# Patient Record
Sex: Female | Born: 1950 | Race: Black or African American | Hispanic: No | Smoking: Never smoker
Health system: Southern US, Community
[De-identification: ages and names within clinical notes are randomized; demographics above are authoritative.]

## PROBLEM LIST (undated history)

## (undated) DIAGNOSIS — I10 Essential (primary) hypertension: Secondary | ICD-10-CM

## (undated) HISTORY — DX: Essential (primary) hypertension: I10

---

## 2007-04-24 ENCOUNTER — Other Ambulatory Visit: Admission: RE | Admit: 2007-04-24 | Discharge: 2007-04-24 | Payer: Self-pay | Admitting: Family Medicine

## 2007-06-11 ENCOUNTER — Ambulatory Visit: Payer: Self-pay | Admitting: Internal Medicine

## 2008-01-21 ENCOUNTER — Emergency Department (HOSPITAL_COMMUNITY): Admission: EM | Admit: 2008-01-21 | Discharge: 2008-01-21 | Payer: Self-pay | Admitting: Emergency Medicine

## 2008-06-29 ENCOUNTER — Emergency Department (HOSPITAL_COMMUNITY): Admission: EM | Admit: 2008-06-29 | Discharge: 2008-06-29 | Payer: Self-pay | Admitting: Emergency Medicine

## 2010-05-24 ENCOUNTER — Emergency Department (HOSPITAL_COMMUNITY)
Admission: EM | Admit: 2010-05-24 | Discharge: 2010-05-24 | Disposition: A | Discharge status: Home / Self Care | Payer: No Typology Code available for payment source | Attending: Emergency Medicine | Admitting: Emergency Medicine

## 2010-05-24 DIAGNOSIS — Y92410 Unspecified street and highway as the place of occurrence of the external cause: Secondary | ICD-10-CM | POA: Insufficient documentation

## 2010-05-24 DIAGNOSIS — T1490XA Injury, unspecified, initial encounter: Secondary | ICD-10-CM | POA: Insufficient documentation

## 2012-11-30 ENCOUNTER — Telehealth: Payer: Self-pay | Admitting: Internal Medicine

## 2012-11-30 NOTE — Telephone Encounter (Signed)
LVOM FOR PT TO RETURN CALL IN RE TO REFERRAL.  °

## 2012-11-30 NOTE — Telephone Encounter (Signed)
S/W PT AND GVE NP APPT 10/20 @ 11 W/DR. MOHAMED REFERRING DR. SHARON WOLTERS DX-LOW WBC WELCOME PACKET MAILED.

## 2012-11-30 NOTE — Telephone Encounter (Signed)
C/D 11/30/12 for appt. 12/21/12

## 2012-12-01 ENCOUNTER — Telehealth: Payer: Self-pay | Admitting: Internal Medicine

## 2012-12-01 NOTE — Telephone Encounter (Signed)
C/D 12/01/12 for appt. 10/20

## 2012-12-21 ENCOUNTER — Other Ambulatory Visit (HOSPITAL_BASED_OUTPATIENT_CLINIC_OR_DEPARTMENT_OTHER): Payer: BC Managed Care – PPO | Admitting: Lab

## 2012-12-21 ENCOUNTER — Telehealth: Payer: Self-pay | Admitting: Internal Medicine

## 2012-12-21 ENCOUNTER — Encounter: Payer: Self-pay | Admitting: Internal Medicine

## 2012-12-21 ENCOUNTER — Ambulatory Visit: Payer: BC Managed Care – PPO

## 2012-12-21 ENCOUNTER — Ambulatory Visit (HOSPITAL_BASED_OUTPATIENT_CLINIC_OR_DEPARTMENT_OTHER): Payer: BC Managed Care – PPO | Admitting: Internal Medicine

## 2012-12-21 ENCOUNTER — Ambulatory Visit (HOSPITAL_BASED_OUTPATIENT_CLINIC_OR_DEPARTMENT_OTHER): Payer: BC Managed Care – PPO | Admitting: Lab

## 2012-12-21 VITALS — BP 120/80 | HR 73 | Temp 97.4°F | Resp 20 | Ht 64.0 in | Wt 160.3 lb

## 2012-12-21 DIAGNOSIS — M009 Pyogenic arthritis, unspecified: Secondary | ICD-10-CM

## 2012-12-21 DIAGNOSIS — I10 Essential (primary) hypertension: Secondary | ICD-10-CM | POA: Insufficient documentation

## 2012-12-21 DIAGNOSIS — D72819 Decreased white blood cell count, unspecified: Secondary | ICD-10-CM

## 2012-12-21 DIAGNOSIS — D539 Nutritional anemia, unspecified: Secondary | ICD-10-CM

## 2012-12-21 LAB — COMPREHENSIVE METABOLIC PANEL (CC13)
ALT: 16 U/L (ref 0–55)
AST: 14 U/L (ref 5–34)
Albumin: 3.6 g/dL (ref 3.5–5.0)
BUN: 14.6 mg/dL (ref 7.0–26.0)
Calcium: 9.7 mg/dL (ref 8.4–10.4)
Chloride: 108 mEq/L (ref 98–109)
Potassium: 4 mEq/L (ref 3.5–5.1)
Sodium: 141 mEq/L (ref 136–145)
Total Protein: 8.7 g/dL — ABNORMAL HIGH (ref 6.4–8.3)

## 2012-12-21 LAB — CBC WITH DIFFERENTIAL/PLATELET
Basophils Absolute: 0 10*3/uL (ref 0.0–0.1)
EOS%: 5.7 % (ref 0.0–7.0)
HGB: 12.2 g/dL (ref 11.6–15.9)
MCH: 29.4 pg (ref 25.1–34.0)
NEUT#: 0.9 10*3/uL — ABNORMAL LOW (ref 1.5–6.5)
RDW: 13.7 % (ref 11.2–14.5)
lymph#: 0.8 10*3/uL — ABNORMAL LOW (ref 0.9–3.3)

## 2012-12-21 LAB — LACTATE DEHYDROGENASE (CC13): LDH: 123 U/L — ABNORMAL LOW (ref 125–245)

## 2012-12-21 NOTE — Patient Instructions (Signed)
Followup in 3 weeks with repeat CBC

## 2012-12-21 NOTE — Progress Notes (Signed)
Checked in new pt with no financial concerns. °

## 2012-12-21 NOTE — Telephone Encounter (Signed)
Pt sent back to lab and given schedule for November.

## 2012-12-21 NOTE — Progress Notes (Signed)
[facility] Telephone:([phone]   Fax:([phone]  CONSULT NOTE  REFERRING PHYSICIAN: Dr. Mila Palmer  REASON FOR CONSULTATION:  62 years old white female with Leukocytopenia  HPI Maria Berry is a 62 y.o. female with no significant past medical history except for hypertension and arthritis. The patient was seen recently by her primary care physician Dr. Aundria Rud for routine evaluation and management of hypertension. CBC was performed on 11/19/2012 and it showed white blood count of 2.1 with absolute neutrophil count of 800. Hemoglobin is normal at 12.7 hematocrit 37.0% with a normal platelets count of 212,000. Recent HIV test was nonreactive. The patient mentions that she had white blood count in 2005 and nothing was done since that time. She does not have any history of recent viral infection or hematologic disorder. The patient and does not use any over the counter NSAIDs but only multivitamins and baby aspirin.  She is feeling fine today with no specific complaints. She denied having any significant fever or chills, no weight loss or night sweats. The patient denied having any chest pain, shortness breath, cough or hemoptysis. She has no history of bleeding, bruises or ecchymosis. She feels tired at times.  She has no family history of malignancy or blood disorder.  HPI  PAST MEDICAL HISTORY: Significant only for hypertension and arthritis. The patient denied having any history of diabetes mellitus, chronic heart disease, stroke or hypercholesterolemia.  FAMILY HISTORY: Mother died from stroke and has history of diabetes mellitus, father died from motor vehicle accident and several family members with diabetes mellitus.  SOCIAL HISTORY: She is single and has 3 children. She was accompanied by her daughter, Maria Berry.  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 She has a history of smoking less than one pack per day for around 40 years and quit 2 months ago. She  has no history of alcohol or drug abuse.  No Known Allergies  Current Outpatient Prescriptions  Medication Sig Dispense Refill  . aspirin 81 MG tablet Take 81 mg by mouth daily.      Marland Kitchen losartan-hydrochlorothiazide (HYZAAR) 50-12.5 MG per tablet Take 1 tablet by mouth daily.      . Multiple Vitamin (MULTIVITAMIN) tablet Take 1 tablet by mouth daily.       No current facility-administered medications for this visit.    Review of Systems  Constitutional: positive for fatigue Eyes: negative Ears, nose, mouth, throat, and face: negative Respiratory: negative Cardiovascular: negative Gastrointestinal: negative Genitourinary:negative Integument/breast: negative Hematologic/lymphatic: negative Musculoskeletal:negative Neurological: negative Behavioral/Psych: negative Endocrine: negative Allergic/Immunologic: negative  Physical Exam  AVW:UJWJX, well-developed with no acute distress SKIN: skin color, texture, turgor are normal, no rashes or significant lesions HEAD: Normocephalic, No masses, lesions, tenderness or abnormalities EYES: normal, PERRLA EARS: External ears normal, Canals clear OROPHARYNX:no exudate, no erythema and lips, buccal mucosa, and tongue normal  NECK: supple, no adenopathy, no JVD LYMPH:  no palpable lymphadenopathy, no hepatosplenomegaly BREAST:not examined LUNGS: clear to auscultation , and palpation HEART: regular rate & rhythm and no murmurs ABDOMEN:abdomen soft, non-tender, normal bowel sounds and no masses or organomegaly BACK: Back symmetric, no curvature., No CVA tenderness EXTREMITIES:no joint deformities, effusion, or inflammation, no edema, no skin discoloration  NEURO: alert & oriented x 3 with fluent speech, no focal motor/sensory deficits  PERFORMANCE STATUS: ECOG 0  LABORATORY DATA: No results found for this basename: WBC, HGB, HCT, MCV, PLT      Chemistry   No results found for this basename: NA, K,  CL, CO2, BUN, CREATININE, GLU   No  results found for this basename: CALCIUM, ALKPHOS, AST, ALT, BILITOT       RADIOGRAPHIC STUDIES: No results found.  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   PLAN: I have a lengthy discussion with the patient and her daughter. I ordered several studies today to evaluate her leukocytopenia and neutropenia. This includes repeat CBC, comprehensive metabolic panel, LDH, serum folate, vitamin B 12 level, hepatitis panel, ANA and rheumatoid factor. I will see the patient back for followup visit in 3 weeks for reevaluation with repeat CBC. If no clear etiology for her leukocytopenia and neutropenia, I may consider the patient for a bone marrow biopsy and aspirate to rule out any other bone marrow abnormality. I gave the patient and her daughter the time to ask questions and I answered them completely to their satisfaction She was advised to call immediately if she has any concerning symptoms in the interval. The patient voices understanding of current disease status and treatment options and is in agreement with the current care plan.  All questions were answered. The patient knows to call the clinic with any problems, questions or concerns. We can certainly see the patient much sooner if necessary.  Thank you so much for allowing me to participate in the care of Maria Berry. I will continue to follow up the patient with you and assist in her care.  I spent 40 minutes counseling the patient face to face. The total time spent in the appointment was 55 minutes.  Babbie Dondlinger K. 12/21/2012, 12:14 PM

## 2012-12-23 LAB — HEPATITIS PANEL, ACUTE
HCV Ab: NEGATIVE
Hep A IgM: NONREACTIVE
Hep B C IgM: NONREACTIVE

## 2012-12-23 LAB — FOLATE: Folate: >20 ng/mL

## 2013-01-12 ENCOUNTER — Encounter: Payer: Self-pay | Admitting: Internal Medicine

## 2013-01-12 ENCOUNTER — Other Ambulatory Visit (HOSPITAL_BASED_OUTPATIENT_CLINIC_OR_DEPARTMENT_OTHER): Payer: BC Managed Care – PPO | Admitting: Lab

## 2013-01-12 ENCOUNTER — Ambulatory Visit (HOSPITAL_BASED_OUTPATIENT_CLINIC_OR_DEPARTMENT_OTHER): Payer: BC Managed Care – PPO | Admitting: Internal Medicine

## 2013-01-12 VITALS — BP 123/77 | HR 73 | Temp 97.8°F | Resp 18 | Ht 64.0 in | Wt 162.5 lb

## 2013-01-12 DIAGNOSIS — D709 Neutropenia, unspecified: Secondary | ICD-10-CM

## 2013-01-12 DIAGNOSIS — D72819 Decreased white blood cell count, unspecified: Secondary | ICD-10-CM

## 2013-01-12 DIAGNOSIS — D539 Nutritional anemia, unspecified: Secondary | ICD-10-CM

## 2013-01-12 LAB — CBC WITH DIFFERENTIAL/PLATELET
BASO%: 1.1 % (ref 0.0–2.0)
EOS%: 5.4 % (ref 0.0–7.0)
MCH: 29.1 pg (ref 25.1–34.0)
MCHC: 32.9 g/dL (ref 31.5–36.0)
MONO#: 0.4 10*3/uL (ref 0.1–0.9)
RBC: 4.23 10*6/uL (ref 3.70–5.45)
RDW: 13.6 % (ref 11.2–14.5)
WBC: 2.8 10*3/uL — ABNORMAL LOW (ref 3.9–10.3)
lymph#: 1.3 10*3/uL (ref 0.9–3.3)

## 2013-01-12 NOTE — Progress Notes (Signed)
[facility] Telephone:([phone]   Fax:([phone]  OFFICE PROGRESS NOTE  Emeterio Reeve, MD [address] [place] [address]  DIAGNOSIS:  Leukocytopenia and neutropenia of unknown etiology  PRIOR THERAPY: None  CURRENT THERAPY: Observation  INTERVAL HISTORY: Maria Berry 62 y.o. female returns to the clinic today for followup visit accompanied by her daughter. The patient has no complaints today. She denied having any significant weight loss but has occasional night sweats. She denied having any bleeding issues or palpable lymphadenopathy. The patient denied having any fever or chills. She denied having any nausea or vomiting. She has no recent infection. She was seen recently for evaluation of thrombocytopenia and neutropenia and she had several studies performed including vitamin B12 that was normal at 422, serum folate over 20, ANA was negative, rheumatoid factor was less than 10 and hepatitis panel was negative.  ALLERGIES:  has No Known Allergies.  MEDICATIONS:  Current Outpatient Prescriptions  Medication Sig Dispense Refill  . aspirin 81 MG tablet Take 81 mg by mouth daily.      Marland Kitchen losartan-hydrochlorothiazide (HYZAAR) 50-12.5 MG per tablet Take 1 tablet by mouth daily.      . Multiple Vitamin (MULTIVITAMIN) tablet Take 1 tablet by mouth daily.       No current facility-administered medications for this visit.    REVIEW OF SYSTEMS:  A comprehensive review of systems was negative.   PHYSICAL EXAMINATION: General appearance: alert, cooperative and no distress Head: Normocephalic, without obvious abnormality, atraumatic Neck: no adenopathy, no JVD, supple, symmetrical, trachea midline and thyroid not enlarged, symmetric, no tenderness/mass/nodules Lymph nodes: Cervical, supraclavicular, and axillary nodes normal. Resp: clear to auscultation bilaterally Back: symmetric, no curvature. ROM normal. No CVA tenderness. Cardio:  regular rate and rhythm, S1, S2 normal, no murmur, click, rub or gallop GI: soft, non-tender; bowel sounds normal; no masses,  no organomegaly Extremities: extremities normal, atraumatic, no cyanosis or edema  ECOG PERFORMANCE STATUS: 0 - Asymptomatic  Blood pressure 123/77, pulse 73, temperature 97.8 F (36.6 C), temperature source Oral, resp. rate 18, height 5\' 4"  (1.626 m), weight 162 lb 8 oz (73.71 kg).  LABORATORY DATA: Lab Results  Component Value Date   WBC 2.1* 12/21/2012   HGB 12.2 12/21/2012   HCT 36.1 12/21/2012   MCV 86.8 12/21/2012   PLT 259 12/21/2012      Chemistry      Component Value Date/Time   NA 141 12/21/2012 1219   K 4.0 12/21/2012 1219   CO2 23 12/21/2012 1219   BUN 14.6 12/21/2012 1219   CREATININE 0.8 12/21/2012 1219      Component Value Date/Time   CALCIUM 9.7 12/21/2012 1219   ALKPHOS 71 12/21/2012 1219   AST 14 12/21/2012 1219   ALT 16 12/21/2012 1219   BILITOT 0.30 12/21/2012 1219       RADIOGRAPHIC STUDIES: No results found.  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 Her total white blood count is better than few weeks ago and absolute neutrophil count is stable. I gave the patient her options for further evaluation of her condition including proceeding with the bone marrow biopsy and aspirate versus continuous observation and repeat blood work in few months. The patient is reluctant to proceed with the bone marrow biopsy at this point and she would like to continue on observation.  I would see her back for followup visit in 3 months with repeat CBC, comprehensive metabolic panel and LDH. She was advised to call immediately if she has any concerning symptoms in the interval. The patient voices understanding of current disease status and treatment options and is in agreement with the current care plan.  All  questions were answered. The patient knows to call the clinic with any problems, questions or concerns. We can certainly see the patient much sooner if necessary.

## 2013-01-12 NOTE — Patient Instructions (Signed)
Followup in 3 months with repeat blood work.

## 2013-01-13 ENCOUNTER — Telehealth: Payer: Self-pay | Admitting: Internal Medicine

## 2013-01-13 NOTE — Telephone Encounter (Signed)
none of the #'s are in service...mailed pt appt sched, avs and letter

## 2013-04-07 ENCOUNTER — Telehealth: Payer: Self-pay | Admitting: Internal Medicine

## 2013-04-07 ENCOUNTER — Other Ambulatory Visit (HOSPITAL_BASED_OUTPATIENT_CLINIC_OR_DEPARTMENT_OTHER): Payer: BC Managed Care – PPO

## 2013-04-07 ENCOUNTER — Ambulatory Visit (HOSPITAL_BASED_OUTPATIENT_CLINIC_OR_DEPARTMENT_OTHER): Payer: BC Managed Care – PPO | Admitting: Internal Medicine

## 2013-04-07 ENCOUNTER — Encounter: Payer: Self-pay | Admitting: Internal Medicine

## 2013-04-07 VITALS — BP 121/79 | HR 73 | Temp 97.2°F | Resp 18 | Ht 64.0 in | Wt 170.4 lb

## 2013-04-07 DIAGNOSIS — D72819 Decreased white blood cell count, unspecified: Secondary | ICD-10-CM

## 2013-04-07 DIAGNOSIS — D709 Neutropenia, unspecified: Secondary | ICD-10-CM

## 2013-04-07 LAB — COMPREHENSIVE METABOLIC PANEL (CC13)
ALK PHOS: 72 U/L (ref 40–150)
ALT: 30 U/L (ref 0–55)
AST: 19 U/L (ref 5–34)
Albumin: 3.9 g/dL (ref 3.5–5.0)
Anion Gap: 8 mEq/L (ref 3–11)
BILIRUBIN TOTAL: 0.39 mg/dL (ref 0.20–1.20)
BUN: 20.8 mg/dL (ref 7.0–26.0)
CO2: 26 mEq/L (ref 22–29)
Calcium: 10 mg/dL (ref 8.4–10.4)
Chloride: 106 mEq/L (ref 98–109)
Creatinine: 0.8 mg/dL (ref 0.6–1.1)
GLUCOSE: 109 mg/dL (ref 70–140)
Potassium: 4 mEq/L (ref 3.5–5.1)
SODIUM: 140 meq/L (ref 136–145)
TOTAL PROTEIN: 8.1 g/dL (ref 6.4–8.3)

## 2013-04-07 LAB — CBC WITH DIFFERENTIAL/PLATELET
BASO%: 0.5 % (ref 0.0–2.0)
Basophils Absolute: 0 10*3/uL (ref 0.0–0.1)
EOS ABS: 0.4 10*3/uL (ref 0.0–0.5)
EOS%: 13.2 % — ABNORMAL HIGH (ref 0.0–7.0)
HCT: 37 % (ref 34.8–46.6)
HGB: 12.5 g/dL (ref 11.6–15.9)
LYMPH%: 38.6 % (ref 14.0–49.7)
MCH: 30.3 pg (ref 25.1–34.0)
MCHC: 33.7 g/dL (ref 31.5–36.0)
MCV: 89.7 fL (ref 79.5–101.0)
MONO#: 0.3 10*3/uL (ref 0.1–0.9)
MONO%: 9 % (ref 0.0–14.0)
NEUT%: 38.7 % (ref 38.4–76.8)
NEUTROS ABS: 1.3 10*3/uL — AB (ref 1.5–6.5)
PLATELETS: 269 10*3/uL (ref 145–400)
RBC: 4.12 10*6/uL (ref 3.70–5.45)
RDW: 12.6 % (ref 11.2–14.5)
WBC: 3.2 10*3/uL — ABNORMAL LOW (ref 3.9–10.3)
lymph#: 1.3 10*3/uL (ref 0.9–3.3)

## 2013-04-07 LAB — LACTATE DEHYDROGENASE (CC13): LDH: 147 U/L (ref 125–245)

## 2013-04-07 NOTE — Patient Instructions (Signed)
Followup visit in 3 months with repeat CBC and LDH. 

## 2013-04-07 NOTE — Telephone Encounter (Signed)
all #'s no longer in service...mailed pt appt sched, avs and letter

## 2013-04-07 NOTE — Progress Notes (Signed)
[facility] Telephone:(336) [phone]   [phone]  OFFICE PROGRESS NOTE  Emeterio ReeveWOLTERS,SHARON A, MD [address] [place] [id number]  DIAGNOSIS:  Leukocytopenia and neutropenia of unknown etiology  PRIOR THERAPY: None  CURRENT THERAPY: Observation  INTERVAL HISTORY: Maria Berry 63 y.o. female returns to the clinic today for followup visit accompanied by her daughter. The patient has no complaints today. She denied having any significant weight loss or night sweats. The patient denied having any fever or chills. She denied having any nausea or vomiting. She has no recent infection. She has no chest pain, shortness breath, cough or hemoptysis. The patient had repeat CBC performed earlier today and she is here for evaluation and discussion of her lab results.  ALLERGIES:  has No Known Allergies.  MEDICATIONS:  Current Outpatient Prescriptions  Medication Sig Dispense Refill  . aspirin 81 MG tablet Take 81 mg by mouth daily.      Marland Kitchen. losartan-hydrochlorothiazide (HYZAAR) 50-12.5 MG per tablet Take 1 tablet by mouth daily.      . Multiple Vitamin (MULTIVITAMIN) tablet Take 1 tablet by mouth daily.       No current facility-administered medications for this visit.    REVIEW OF SYSTEMS:  A comprehensive review of systems was negative.   PHYSICAL EXAMINATION: General appearance: alert, cooperative and no distress Head: Normocephalic, without obvious abnormality, atraumatic Neck: no adenopathy, no JVD, supple, symmetrical, trachea midline and thyroid not enlarged, symmetric, no tenderness/mass/nodules Lymph nodes: Cervical, supraclavicular, and axillary nodes normal. Resp: clear to auscultation bilaterally Back: symmetric, no curvature. ROM normal. No CVA tenderness. Cardio: regular rate and rhythm, S1, S2 normal, no murmur, click, rub or gallop GI: soft, non-tender; bowel sounds normal; no masses,  no organomegaly Extremities: extremities  normal, atraumatic, no cyanosis or edema  ECOG PERFORMANCE STATUS: 0 - Asymptomatic  Blood pressure 121/79, pulse 73, temperature 97.2 F (36.2 C), temperature source Oral, resp. rate 18, height 5\' 4"  (1.626 m), weight 170 lb 6.4 oz (77.293 kg).  LABORATORY DATA: Lab Results  Component Value Date   WBC 3.2* 04/07/2013   HGB 12.5 04/07/2013   HCT 37.0 04/07/2013   MCV 89.7 04/07/2013   PLT 269 04/07/2013      Chemistry      Component Value Date/Time   NA 141 12/21/2012 1219   K 4.0 12/21/2012 1219   CO2 23 12/21/2012 1219   BUN 14.6 12/21/2012 1219   CREATININE 0.8 12/21/2012 1219      Component Value Date/Time   CALCIUM 9.7 12/21/2012 1219   ALKPHOS 71 12/21/2012 1219   AST 14 12/21/2012 1219   ALT 16 12/21/2012 1219   BILITOT 0.30 12/21/2012 1219       RADIOGRAPHIC STUDIES: No results found.  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 Her total white blood count and absolute neutrophil count are better today. I recommended for her to continue on observation. I would see her back for followup visit in 3 months with repeat CBC, and LDH. She was advised to call immediately if she has any concerning symptoms in the interval. The patient voices understanding of current disease status and treatment options and is in agreement with the current care plan.  All questions were answered. The patient knows to call the clinic with any problems, questions or concerns. We can certainly see  the patient much sooner if necessary.  Disclaimer: This note was dictated with voice recognition software. Similar sounding words can inadvertently be transcribed and may not be corrected upon review.

## 2013-05-18 ENCOUNTER — Other Ambulatory Visit: Payer: Self-pay | Admitting: Family Medicine

## 2013-05-18 ENCOUNTER — Other Ambulatory Visit (HOSPITAL_COMMUNITY)
Admission: RE | Admit: 2013-05-18 | Discharge: 2013-05-18 | Disposition: A | Discharge status: Home / Self Care | Payer: BC Managed Care – PPO | Source: Ambulatory Visit | Attending: Family Medicine | Admitting: Family Medicine

## 2013-05-18 DIAGNOSIS — Z1151 Encounter for screening for human papillomavirus (HPV): Secondary | ICD-10-CM | POA: Insufficient documentation

## 2013-05-18 DIAGNOSIS — Z124 Encounter for screening for malignant neoplasm of cervix: Secondary | ICD-10-CM | POA: Insufficient documentation

## 2013-07-05 ENCOUNTER — Encounter: Payer: Self-pay | Admitting: Internal Medicine

## 2013-07-05 ENCOUNTER — Other Ambulatory Visit (HOSPITAL_BASED_OUTPATIENT_CLINIC_OR_DEPARTMENT_OTHER): Payer: BC Managed Care – PPO

## 2013-07-05 ENCOUNTER — Ambulatory Visit (HOSPITAL_BASED_OUTPATIENT_CLINIC_OR_DEPARTMENT_OTHER): Payer: BC Managed Care – PPO | Admitting: Internal Medicine

## 2013-07-05 VITALS — BP 130/78 | HR 76 | Temp 97.7°F | Resp 18 | Ht 64.0 in | Wt 178.7 lb

## 2013-07-05 DIAGNOSIS — D72819 Decreased white blood cell count, unspecified: Secondary | ICD-10-CM

## 2013-07-05 DIAGNOSIS — D709 Neutropenia, unspecified: Secondary | ICD-10-CM

## 2013-07-05 LAB — CBC WITH DIFFERENTIAL/PLATELET
BASO%: 0.9 % (ref 0.0–2.0)
BASOS ABS: 0 10*3/uL (ref 0.0–0.1)
EOS ABS: 0.2 10*3/uL (ref 0.0–0.5)
EOS%: 5.5 % (ref 0.0–7.0)
HEMATOCRIT: 37 % (ref 34.8–46.6)
HEMOGLOBIN: 12.3 g/dL (ref 11.6–15.9)
LYMPH#: 1.3 10*3/uL (ref 0.9–3.3)
LYMPH%: 37.8 % (ref 14.0–49.7)
MCH: 29.5 pg (ref 25.1–34.0)
MCHC: 33.4 g/dL (ref 31.5–36.0)
MCV: 88.6 fL (ref 79.5–101.0)
MONO#: 0.4 10*3/uL (ref 0.1–0.9)
MONO%: 12.1 % (ref 0.0–14.0)
NEUT#: 1.5 10*3/uL (ref 1.5–6.5)
NEUT%: 43.7 % (ref 38.4–76.8)
Platelets: 288 10*3/uL (ref 145–400)
RBC: 4.17 10*6/uL (ref 3.70–5.45)
RDW: 13.2 % (ref 11.2–14.5)
WBC: 3.4 10*3/uL — AB (ref 3.9–10.3)

## 2013-07-05 LAB — LACTATE DEHYDROGENASE (CC13): LDH: 144 U/L (ref 125–245)

## 2013-07-05 NOTE — Progress Notes (Signed)
[facility] Telephone:(336) [phone]   Fax:(336) [phone]  OFFICE PROGRESS NOTE  Emeterio ReeveWOLTERS,SHARON A, MD [address] [place] [id number]  DIAGNOSIS:  Leukocytopenia and neutropenia of unknown etiology  PRIOR THERAPY: None  CURRENT THERAPY: Observation  INTERVAL HISTORY: Maria Berry 63 y.o. female returns to the clinic today for followup visit. The patient has no complaints today. She denied having any significant weight loss or night sweats. The patient denied having any fever or chills. She denied having any nausea or vomiting. She has no recent infection. She has no chest pain, shortness breath, cough or hemoptysis. The patient had repeat CBC performed earlier today and she is here for evaluation and discussion of her lab results.  ALLERGIES:  has No Known Allergies.  MEDICATIONS:  Current Outpatient Prescriptions  Medication Sig Dispense Refill  . aspirin 81 MG tablet Take 81 mg by mouth daily.      Marland Kitchen. losartan-hydrochlorothiazide (HYZAAR) 50-12.5 MG per tablet Take 1 tablet by mouth daily.      . Multiple Vitamin (MULTIVITAMIN) tablet Take 1 tablet by mouth daily.       No current facility-administered medications for this visit.    REVIEW OF SYSTEMS:  A comprehensive review of systems was negative.   PHYSICAL EXAMINATION: General appearance: alert, cooperative and no distress Head: Normocephalic, without obvious abnormality, atraumatic Neck: no adenopathy, no JVD, supple, symmetrical, trachea midline and thyroid not enlarged, symmetric, no tenderness/mass/nodules Lymph nodes: Cervical, supraclavicular, and axillary nodes normal. Resp: clear to auscultation bilaterally Back: symmetric, no curvature. ROM normal. No CVA tenderness. Cardio: regular rate and rhythm, S1, S2 normal, no murmur, click, rub or gallop GI: soft, non-tender; bowel sounds normal; no masses,  no organomegaly Extremities: extremities normal, atraumatic, no  cyanosis or edema  ECOG PERFORMANCE STATUS: 0 - Asymptomatic  Blood pressure 130/78, pulse 76, temperature 97.7 F (36.5 C), resp. rate 18, height 5\' 4"  (1.626 m), weight 178 lb 11.2 oz (81.058 kg), SpO2 100.00%.  LABORATORY DATA: Lab Results  Component Value Date   WBC 3.4* 07/05/2013   HGB 12.3 07/05/2013   HCT 37.0 07/05/2013   MCV 88.6 07/05/2013   PLT 288 07/05/2013      Chemistry      Component Value Date/Time   NA 140 04/07/2013 0850   K 4.0 04/07/2013 0850   CO2 26 04/07/2013 0850   BUN 20.8 04/07/2013 0850   CREATININE 0.8 04/07/2013 0850      Component Value Date/Time   CALCIUM 10.0 04/07/2013 0850   ALKPHOS 72 04/07/2013 0850   AST 19 04/07/2013 0850   ALT 30 04/07/2013 0850   BILITOT 0.39 04/07/2013 0850       RADIOGRAPHIC STUDIES: No results found.  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 Her total white blood count and absolute neutrophil count are improving. I recommended for her to continue on observation. I would see her back for followup visit in 6 months with repeat CBC, and LDH. She was advised to call immediately if she has any concerning symptoms in the interval. The patient voices understanding of current disease status and treatment options and is in agreement with the current care plan.  All questions were answered. The patient knows to call the clinic with any problems, questions or concerns. We can certainly see the patient much sooner if necessary.  Disclaimer: This note was dictated with voice recognition software. Similar sounding words can inadvertently be transcribed and may not be corrected upon review.

## 2013-07-08 ENCOUNTER — Telehealth: Payer: Self-pay | Admitting: Internal Medicine

## 2013-07-08 NOTE — Telephone Encounter (Signed)
S/w the pt and she is aware of her nov appts. °

## 2014-01-05 ENCOUNTER — Encounter: Payer: Self-pay | Admitting: Internal Medicine

## 2014-01-05 ENCOUNTER — Other Ambulatory Visit (HOSPITAL_BASED_OUTPATIENT_CLINIC_OR_DEPARTMENT_OTHER): Payer: BC Managed Care – PPO

## 2014-01-05 ENCOUNTER — Ambulatory Visit (HOSPITAL_BASED_OUTPATIENT_CLINIC_OR_DEPARTMENT_OTHER): Payer: BC Managed Care – PPO | Admitting: Internal Medicine

## 2014-01-05 VITALS — BP 128/83 | HR 96 | Temp 98.5°F | Resp 19 | Ht 64.0 in | Wt 183.3 lb

## 2014-01-05 DIAGNOSIS — D72819 Decreased white blood cell count, unspecified: Secondary | ICD-10-CM

## 2014-01-05 LAB — CBC WITH DIFFERENTIAL/PLATELET
BASO%: 0.5 % (ref 0.0–2.0)
BASOS ABS: 0 10*3/uL (ref 0.0–0.1)
EOS ABS: 0.2 10*3/uL (ref 0.0–0.5)
EOS%: 3.4 % (ref 0.0–7.0)
HCT: 37.8 % (ref 34.8–46.6)
HEMOGLOBIN: 12.5 g/dL (ref 11.6–15.9)
LYMPH%: 26.4 % (ref 14.0–49.7)
MCH: 28.9 pg (ref 25.1–34.0)
MCHC: 33.1 g/dL (ref 31.5–36.0)
MCV: 87.5 fL (ref 79.5–101.0)
MONO#: 0.4 10*3/uL (ref 0.1–0.9)
MONO%: 6.9 % (ref 0.0–14.0)
NEUT%: 62.8 % (ref 38.4–76.8)
NEUTROS ABS: 3.6 10*3/uL (ref 1.5–6.5)
PLATELETS: 283 10*3/uL (ref 145–400)
RBC: 4.32 10*6/uL (ref 3.70–5.45)
RDW: 13.5 % (ref 11.2–14.5)
WBC: 5.7 10*3/uL (ref 3.9–10.3)
lymph#: 1.5 10*3/uL (ref 0.9–3.3)

## 2014-01-05 LAB — LACTATE DEHYDROGENASE (CC13): LDH: 158 U/L (ref 125–245)

## 2014-01-05 NOTE — Progress Notes (Signed)
[facility] Telephone:([phone]   Fax:(336) [phone]  OFFICE PROGRESS NOTE  Emeterio ReeveWOLTERS,SHARON A, MD [address] [place] [id number]  DIAGNOSIS:  Leukocytopenia and neutropenia of unknown etiology  PRIOR THERAPY: None  CURRENT THERAPY: Observation  INTERVAL HISTORY: Maria Berry 63 y.o. female returns to the clinic today for followup visit. The patient has no complaints today. She denied having any significant weight loss or night sweats. The patient denied having any fever or chills. She denied having any nausea or vomiting. She has no recent infection. She has no chest pain, shortness of breath, cough or hemoptysis. The patient had repeat CBC performed earlier today and she is here for evaluation and discussion of her lab results.  ALLERGIES:  has No Known Allergies.  MEDICATIONS:  Current Outpatient Prescriptions  Medication Sig Dispense Refill  . aspirin 81 MG tablet Take 81 mg by mouth daily.    Marland Kitchen. losartan-hydrochlorothiazide (HYZAAR) 50-12.5 MG per tablet Take 1 tablet by mouth daily.    . Multiple Vitamin (MULTIVITAMIN) tablet Take 1 tablet by mouth daily.     No current facility-administered medications for this visit.    REVIEW OF SYSTEMS:  A comprehensive review of systems was negative.   PHYSICAL EXAMINATION: General appearance: alert, cooperative and no distress Head: Normocephalic, without obvious abnormality, atraumatic Neck: no adenopathy, no JVD, supple, symmetrical, trachea midline and thyroid not enlarged, symmetric, no tenderness/mass/nodules Lymph nodes: Cervical, supraclavicular, and axillary nodes normal. Resp: clear to auscultation bilaterally Back: symmetric, no curvature. ROM normal. No CVA tenderness. Cardio: regular rate and rhythm, S1, S2 normal, no murmur, click, rub or gallop GI: soft, non-tender; bowel sounds normal; no masses,  no organomegaly Extremities: extremities normal, atraumatic, no  cyanosis or edema  ECOG PERFORMANCE STATUS: 0 - Asymptomatic  Blood pressure 128/83, pulse 96, temperature 98.5 F (36.9 C), temperature source Oral, resp. rate 19, height 5\' 4"  (1.626 m), weight 183 lb 4.8 oz (83.144 kg), SpO2 98 %.  LABORATORY DATA: Lab Results  Component Value Date   WBC 5.7 01/05/2014   HGB 12.5 01/05/2014   HCT 37.8 01/05/2014   MCV 87.5 01/05/2014   PLT 283 01/05/2014      Chemistry      Component Value Date/Time   NA 140 04/07/2013 0850   K 4.0 04/07/2013 0850   CO2 26 04/07/2013 0850   BUN 20.8 04/07/2013 0850   CREATININE 0.8 04/07/2013 0850      Component Value Date/Time   CALCIUM 10.0 04/07/2013 0850   ALKPHOS 72 04/07/2013 0850   AST 19 04/07/2013 0850   ALT 30 04/07/2013 0850   BILITOT 0.39 04/07/2013 0850       RADIOGRAPHIC STUDIES: No results found.  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 Her CBC today showed complete normalization of her blood count. I recommended for the patient to continue on observation with routine follow-up visit with her primary care physician. I will discharge her from the clinic and see her on as needed basis at this point. She was advised to call immediately if she has any concerning symptoms in the interval. The patient voices understanding of current disease status and treatment options and is in agreement with the current care plan.  All questions were answered. The patient knows to call the clinic with any problems, questions or concerns. We  can certainly see the patient much sooner if necessary.  Disclaimer: This note was dictated with voice recognition software. Similar sounding words can inadvertently be transcribed and may not be corrected upon review.

## 2014-06-01 ENCOUNTER — Other Ambulatory Visit: Payer: Self-pay

## 2014-06-01 DIAGNOSIS — Z1231 Encounter for screening mammogram for malignant neoplasm of breast: Secondary | ICD-10-CM

## 2014-06-03 ENCOUNTER — Ambulatory Visit
Admission: RE | Admit: 2014-06-03 | Discharge: 2014-06-03 | Disposition: A | Discharge status: Home / Self Care | Payer: BLUE CROSS/BLUE SHIELD | Source: Ambulatory Visit

## 2014-06-03 DIAGNOSIS — Z1231 Encounter for screening mammogram for malignant neoplasm of breast: Secondary | ICD-10-CM

## 2014-06-08 ENCOUNTER — Other Ambulatory Visit: Payer: Self-pay | Admitting: Family Medicine

## 2014-06-08 DIAGNOSIS — R928 Other abnormal and inconclusive findings on diagnostic imaging of breast: Secondary | ICD-10-CM

## 2014-06-09 ENCOUNTER — Ambulatory Visit
Admission: RE | Admit: 2014-06-09 | Discharge: 2014-06-09 | Disposition: A | Discharge status: Home / Self Care | Payer: BLUE CROSS/BLUE SHIELD | Source: Ambulatory Visit | Attending: Family Medicine | Admitting: Family Medicine

## 2014-06-09 DIAGNOSIS — R928 Other abnormal and inconclusive findings on diagnostic imaging of breast: Secondary | ICD-10-CM

## 2014-11-08 ENCOUNTER — Other Ambulatory Visit: Payer: Self-pay | Admitting: Family Medicine

## 2014-11-08 DIAGNOSIS — N6310 Unspecified lump in the right breast, unspecified quadrant: Secondary | ICD-10-CM

## 2014-12-09 ENCOUNTER — Ambulatory Visit
Admission: RE | Admit: 2014-12-09 | Discharge: 2014-12-09 | Disposition: A | Discharge status: Home / Self Care | Payer: BLUE CROSS/BLUE SHIELD | Source: Ambulatory Visit | Attending: Family Medicine | Admitting: Family Medicine

## 2014-12-09 DIAGNOSIS — N6310 Unspecified lump in the right breast, unspecified quadrant: Secondary | ICD-10-CM

## 2015-05-02 ENCOUNTER — Other Ambulatory Visit: Payer: Self-pay | Admitting: Family Medicine

## 2015-05-02 DIAGNOSIS — N63 Unspecified lump in unspecified breast: Secondary | ICD-10-CM

## 2015-06-05 ENCOUNTER — Ambulatory Visit
Admission: RE | Admit: 2015-06-05 | Discharge: 2015-06-05 | Disposition: A | Discharge status: Home / Self Care | Payer: BLUE CROSS/BLUE SHIELD | Source: Ambulatory Visit | Attending: Family Medicine | Admitting: Family Medicine

## 2015-06-05 DIAGNOSIS — N63 Unspecified lump in unspecified breast: Secondary | ICD-10-CM

## 2015-06-07 ENCOUNTER — Other Ambulatory Visit: Payer: BLUE CROSS/BLUE SHIELD

## 2015-08-02 ENCOUNTER — Encounter: Payer: Self-pay | Admitting: Skilled Nursing Facility1

## 2015-08-02 ENCOUNTER — Encounter: Payer: BLUE CROSS/BLUE SHIELD | Attending: Family Medicine | Admitting: Skilled Nursing Facility1

## 2015-08-02 VITALS — Ht 61.0 in | Wt 187.0 lb

## 2015-08-02 DIAGNOSIS — E119 Type 2 diabetes mellitus without complications: Secondary | ICD-10-CM | POA: Insufficient documentation

## 2015-08-02 NOTE — Progress Notes (Signed)
Diabetes Self-Management Education  Visit Type: First/Initial  Appt. Start Time: 2:00 Appt. End Time: 3:30  08/02/2015  Maria Berry, identified by name and date of birth, is a 65 y.o. female with a diagnosis of Diabetes: Type 2.   ASSESSMENT  Height 5\' 1"  (1.549 m), weight 187 lb (84.823 kg). Body mass index is 35.35 kg/(m^2). Pt states she loves french fries and used to eat out every night but she has cut those behaviors out.     Diabetes Self-Management Education - 08/02/15 1410    Visit Information   Visit Type First/Initial   Initial Visit   Diabetes Type Type 2   Are you currently following a meal plan? No   Are you taking your medications as prescribed? Not on Medications   Date Diagnosed April 2017   Health Coping   How would you rate your overall health? Excellent   Psychosocial Assessment   Patient Belief/Attitude about Diabetes Motivated to manage diabetes   Pre-Education Assessment   Patient understands the diabetes disease and treatment process. Needs Instruction   Patient understands incorporating nutritional management into lifestyle. Needs Instruction   Patient undertands incorporating physical activity into lifestyle. Needs Instruction   Patient understands using medications safely. Needs Instruction   Patient understands monitoring blood glucose, interpreting and using results Needs Instruction   Patient understands prevention, detection, and treatment of acute complications. Needs Instruction   Patient understands prevention, detection, and treatment of chronic complications. Needs Instruction   Patient understands how to develop strategies to address psychosocial issues. Needs Instruction   Patient understands how to develop strategies to promote health/change behavior. Needs Instruction   Complications   Last HgB A1C per patient/outside source 6.6 %   How often do you check your blood sugar? 3-4 times/day   Fasting Blood glucose range (mg/dL)  [phone]   Postprandial Blood glucose range (mg/dL) [phone]   Number of hyperglycemic episodes per week 1  drinks water   Have you had a dilated eye exam in the past 12 months? No   Have you had a dental exam in the past 12 months? No   Are you checking your feet? Yes   How many days per week are you checking your feet? 7   Dietary Intake   Lunch apple and salad and chicken   Snack (afternoon) ntus and cheese and cranberries   Dinner chicken, vegetables, salad   Beverage(s) water   Exercise   Exercise Type Light (walking / raking leaves)   How many days per week to you exercise? 5   How many minutes per day do you exercise? 25   Total minutes per week of exercise 125   Patient Education   Previous Diabetes Education No   Disease state  Definition of diabetes, type 1 and 2, and the diagnosis of diabetes;Factors that contribute to the development of diabetes   Nutrition management  Role of diet in the treatment of diabetes and the relationship between the three main macronutrients and blood glucose level;Food label reading, portion sizes and measuring food.;Carbohydrate counting;Reviewed blood glucose goals for pre and post meals and how to evaluate the patients' food intake on their blood glucose level.;Information on hints to eating out and maintain blood glucose control.   Physical activity and exercise  Role of exercise on diabetes management, blood pressure control and cardiac health.   Monitoring Purpose and frequency of SMBG.;Yearly dilated eye exam;Daily foot exams;Identified appropriate SMBG and/or A1C goals.   Acute complications Discussed and  identified patients' treatment of hyperglycemia.   Chronic complications Dental care;Assessed and discussed foot care and prevention of foot problems;Retinopathy and reason for yearly dilated eye exams   Psychosocial adjustment Role of stress on diabetes;Worked with patient to identify barriers to care and solutions;Identified and addressed  patients feelings and concerns about diabetes   Individualized Goals (developed by patient)   Nutrition Follow meal plan discussed;Adjust meds/carbs with exercise as discussed   Physical Activity 30 minutes per day;Exercise 3-5 times per week   Reducing Risk examine blood glucose patterns;do foot checks daily;increase portions of nuts and seeds;increase portions of olive oil in diet   Post-Education Assessment   Patient understands the diabetes disease and treatment process. Demonstrates understanding / competency   Patient understands incorporating nutritional management into lifestyle. Demonstrates understanding / competency   Patient undertands incorporating physical activity into lifestyle. Demonstrates understanding / competency   Patient understands using medications safely. Demonstrates understanding / competency   Patient understands monitoring blood glucose, interpreting and using results Demonstrates understanding / competency   Patient understands prevention, detection, and treatment of acute complications. Demonstrates understanding / competency   Patient understands prevention, detection, and treatment of chronic complications. Demonstrates understanding / competency   Patient understands how to develop strategies to address psychosocial issues. Demonstrates understanding / competency   Patient understands how to develop strategies to promote health/change behavior. Demonstrates understanding / competency   Outcomes   Expected Outcomes Demonstrated interest in learning. Expect positive outcomes   Future DMSE PRN   Program Status Completed      Individualized Plan for Diabetes Self-Management Training:   Learning Objective:  Patient will have a greater understanding of diabetes self-management. Patient education plan is to attend individual and/or group sessions per assessed needs and concerns.   Plan:   There are no Patient Instructions on file for this visit.  Expected  Outcomes:  Demonstrated interest in learning. Expect positive outcomes  Education material provided: Living Well with Diabetes, Snack sheet, Support group flyer and Carbohydrate counting sheet  If problems or questions, patient to contact team via:  Phone  Future DSME appointment: PRN

## 2016-04-14 ENCOUNTER — Emergency Department (HOSPITAL_COMMUNITY)
Admission: EM | Admit: 2016-04-14 | Discharge: 2016-04-14 | Disposition: A | Discharge status: Home / Self Care | Payer: BLUE CROSS/BLUE SHIELD | Attending: Emergency Medicine | Admitting: Emergency Medicine

## 2016-04-14 ENCOUNTER — Emergency Department (HOSPITAL_COMMUNITY): Payer: BLUE CROSS/BLUE SHIELD

## 2016-04-14 ENCOUNTER — Encounter (HOSPITAL_COMMUNITY): Payer: Self-pay | Admitting: *Deleted

## 2016-04-14 DIAGNOSIS — Y939 Activity, unspecified: Secondary | ICD-10-CM | POA: Insufficient documentation

## 2016-04-14 DIAGNOSIS — S63641A Sprain of metacarpophalangeal joint of right thumb, initial encounter: Secondary | ICD-10-CM

## 2016-04-14 DIAGNOSIS — W010XXA Fall on same level from slipping, tripping and stumbling without subsequent striking against object, initial encounter: Secondary | ICD-10-CM | POA: Insufficient documentation

## 2016-04-14 DIAGNOSIS — I10 Essential (primary) hypertension: Secondary | ICD-10-CM | POA: Insufficient documentation

## 2016-04-14 DIAGNOSIS — S6991XA Unspecified injury of right wrist, hand and finger(s), initial encounter: Secondary | ICD-10-CM | POA: Diagnosis present

## 2016-04-14 DIAGNOSIS — Z79899 Other long term (current) drug therapy: Secondary | ICD-10-CM | POA: Insufficient documentation

## 2016-04-14 DIAGNOSIS — Y999 Unspecified external cause status: Secondary | ICD-10-CM | POA: Insufficient documentation

## 2016-04-14 DIAGNOSIS — Z7982 Long term (current) use of aspirin: Secondary | ICD-10-CM | POA: Insufficient documentation

## 2016-04-14 DIAGNOSIS — S63601A Unspecified sprain of right thumb, initial encounter: Secondary | ICD-10-CM | POA: Diagnosis not present

## 2016-04-14 DIAGNOSIS — Y92410 Unspecified street and highway as the place of occurrence of the external cause: Secondary | ICD-10-CM | POA: Insufficient documentation

## 2016-04-14 NOTE — ED Provider Notes (Signed)
[facility]EMER[facility]PT Provider Note   CSN: [id number] Arrival date & time: 04/14/16  1532  By signing my name below, I, Octavia Heir, attest that this documentation has been prepared under the direction and in the presence of [employer], PA-C.  Electronically Signed: Octavia Heir, ED Scribe. 04/14/16. 5:04 PM.    History   Chief Complaint Chief Complaint  Patient presents with  . Finger Injury   The history is provided by the patient. No language interpreter was used.   HPI Comments: Maria Berry is a 66 y.o. female who presents to the Emergency Department complaining of moderate, gradual worsening, right thumb pain s/p a fall that occurred PTA. She has associated swelling to the area. Pt was leaving church when she slipped and fell on a wet surface. She notes trying to catch herself with her right hand and ended up extending her right thumb out. Pt increased pain with movement and palpation of her right thumb. No medication has been taken to alleviate her pain. She denies numbness or weakness.  Past Medical History:  Diagnosis Date  . Hypertension     Patient Active Problem List   Diagnosis Date Noted  . Leukocytopenia 12/21/2012  . HTN (hypertension) 12/21/2012    History reviewed. No pertinent surgical history.  OB History    No data available       Home Medications    Prior to Admission medications   Medication Sig Start Date End Date Taking? Authorizing Provider  aspirin 81 MG tablet Take 81 mg by mouth daily.    Historical Provider, MD  losartan-hydrochlorothiazide (HYZAAR) 50-12.5 MG per tablet Take 1 tablet by mouth daily.    Historical Provider, MD  Multiple Vitamin (MULTIVITAMIN) tablet Take 1 tablet by mouth daily.    Historical Provider, MD    Family History Family History  Problem Relation Age of Onset  . Diabetes Other   . Stroke Other   . Hypertension Other     Social History Social History  Substance Use Topics  . Smoking status:  Never Smoker  . Smokeless tobacco: Never Used  . Alcohol use No     Allergies   Patient has no known allergies.   Review of Systems Review of Systems  A complete 10 system review of systems was obtained and all systems are negative except as noted in the HPI and PMH.   Physical Exam Updated Vital Signs BP 167/95   Pulse 76   Temp 98.3 F (36.8 C)   Resp 17   SpO2 95%   Physical Exam  Constitutional: She is oriented to person, place, and time. She appears well-developed and well-nourished.  HENT:  Head: Normocephalic.  Eyes: EOM are normal.  Neck: Normal range of motion.  Pulmonary/Chest: Effort normal.  Abdominal: She exhibits no distension.  Musculoskeletal: Normal range of motion. She exhibits edema and tenderness.  Swelling noted at the MCP right first digit. TTP of both ulnar and radial aspects. No significant laxity noted. Limited exam due to pain. Able to flex and extend MCP and DIP, rest of right hand is non TTP  Neurological: She is alert and oriented to person, place, and time.  Psychiatric: She has a normal mood and affect.  Nursing note and vitals reviewed.    ED Treatments / Results  DIAGNOSTIC STUDIES: Oxygen Saturation is 97% on RA, normal by my interpretation.  COORDINATION OF CARE:  4:56 PM Discussed treatment plan with pt at bedside and pt agreed to plan.  Labs (  all labs ordered are listed, but only abnormal results are displayed) Labs Reviewed - No data to display  EKG  EKG Interpretation None       Radiology Dg Finger Thumb Right  Result Date: 04/14/2016 CLINICAL DATA:  Right thumb pain at MCP joint EXAM: RIGHT THUMB 2+V COMPARISON:  None. FINDINGS: No fracture or dislocation is seen. The joint spaces are preserved. The visualized soft tissues are unremarkable. IMPRESSION: Negative. Electronically Signed   By: Charline BillsSriyesh  Krishnan M.D.   On: 04/14/2016 16:25    Procedures Procedures (including critical care time)  Medications Ordered  in ED Medications - No data to display   Initial Impression / Assessment and Plan / ED Course  I have reviewed the triage vital signs and the nursing notes.  Pertinent labs & imaging results that were available during my care of the patient were reviewed by me and considered in my medical decision making (see chart for details).      Final Clinical Impressions(s) / ED Diagnoses   Final diagnoses:  Sprain of metacarpophalangeal (MCP) joint of right thumb, initial encounter   Labs: n/a  Imaging: DG of right thumb  Consults: n/a  Therapeutics: brace for right hand   Discharge Meds:   Assessment/Plan:66 year old female presents today with thumb sprain. No significant laxity on her exam, no acute fractures. She will be placed in a thumb spica, encouraged follow-up with orthopedic for reevaluation and further management. She is given strict and cautions, verbalized understanding and agreement to today's plan had no further questions or concerns  I personally performed the services described in this documentation, which was scribed in my presence. The recorded information has been reviewed and is accurate.   New Prescriptions Discharge Medication List as of 04/14/2016  5:17 PM       Eyvonne MechanicJeffrey Lorina Duffner, PA-C 04/14/16 2039    Raeford RazorStephen Kohut, MD 04/27/16 [id number]

## 2016-04-14 NOTE — ED Triage Notes (Signed)
Pt reports she slipped and fell today, went to catch fall with the right hand and the right thumb extended out. C/o pain and swelling in the right thumb.

## 2016-04-14 NOTE — Discharge Instructions (Signed)
Please read attached information. If you experience any new or worsening signs or symptoms please return to the emergency room for evaluation. Please follow-up with your primary care provider or specialist as discussed.  °

## 2016-06-27 ENCOUNTER — Other Ambulatory Visit: Payer: Self-pay | Admitting: Family Medicine

## 2016-06-27 ENCOUNTER — Other Ambulatory Visit (HOSPITAL_COMMUNITY)
Admission: RE | Admit: 2016-06-27 | Discharge: 2016-06-27 | Disposition: A | Discharge status: Home / Self Care | Payer: BLUE CROSS/BLUE SHIELD | Source: Ambulatory Visit | Attending: Family Medicine | Admitting: Family Medicine

## 2016-06-27 DIAGNOSIS — Z01411 Encounter for gynecological examination (general) (routine) with abnormal findings: Secondary | ICD-10-CM | POA: Insufficient documentation

## 2016-07-01 LAB — CYTOLOGY - PAP: Diagnosis: NEGATIVE

## 2017-01-20 DIAGNOSIS — E119 Type 2 diabetes mellitus without complications: Secondary | ICD-10-CM | POA: Diagnosis not present

## 2017-01-20 DIAGNOSIS — I10 Essential (primary) hypertension: Secondary | ICD-10-CM | POA: Diagnosis not present

## 2017-01-20 DIAGNOSIS — E785 Hyperlipidemia, unspecified: Secondary | ICD-10-CM | POA: Diagnosis not present

## 2017-01-20 DIAGNOSIS — Z23 Encounter for immunization: Secondary | ICD-10-CM | POA: Diagnosis not present

## 2017-01-20 DIAGNOSIS — E1165 Type 2 diabetes mellitus with hyperglycemia: Secondary | ICD-10-CM | POA: Diagnosis not present

## 2017-08-05 DIAGNOSIS — Z23 Encounter for immunization: Secondary | ICD-10-CM | POA: Diagnosis not present

## 2017-08-05 DIAGNOSIS — E1169 Type 2 diabetes mellitus with other specified complication: Secondary | ICD-10-CM | POA: Diagnosis not present

## 2017-08-05 DIAGNOSIS — Z1211 Encounter for screening for malignant neoplasm of colon: Secondary | ICD-10-CM | POA: Diagnosis not present

## 2017-08-05 DIAGNOSIS — I10 Essential (primary) hypertension: Secondary | ICD-10-CM | POA: Diagnosis not present

## 2017-08-05 DIAGNOSIS — E559 Vitamin D deficiency, unspecified: Secondary | ICD-10-CM | POA: Diagnosis not present

## 2017-08-05 DIAGNOSIS — Z79899 Other long term (current) drug therapy: Secondary | ICD-10-CM | POA: Diagnosis not present

## 2017-08-05 DIAGNOSIS — Z1159 Encounter for screening for other viral diseases: Secondary | ICD-10-CM | POA: Diagnosis not present

## 2018-04-09 DIAGNOSIS — E119 Type 2 diabetes mellitus without complications: Secondary | ICD-10-CM | POA: Diagnosis not present

## 2018-06-26 DIAGNOSIS — I10 Essential (primary) hypertension: Secondary | ICD-10-CM | POA: Diagnosis not present

## 2018-06-26 DIAGNOSIS — E1169 Type 2 diabetes mellitus with other specified complication: Secondary | ICD-10-CM | POA: Diagnosis not present

## 2018-06-26 DIAGNOSIS — E785 Hyperlipidemia, unspecified: Secondary | ICD-10-CM | POA: Diagnosis not present

## 2018-10-08 DIAGNOSIS — E559 Vitamin D deficiency, unspecified: Secondary | ICD-10-CM | POA: Diagnosis not present

## 2018-10-08 DIAGNOSIS — Z79899 Other long term (current) drug therapy: Secondary | ICD-10-CM | POA: Diagnosis not present

## 2018-10-08 DIAGNOSIS — E785 Hyperlipidemia, unspecified: Secondary | ICD-10-CM | POA: Diagnosis not present

## 2018-10-08 DIAGNOSIS — Z0000 Encounter for general adult medical examination without abnormal findings: Secondary | ICD-10-CM | POA: Diagnosis not present

## 2018-10-08 DIAGNOSIS — E1169 Type 2 diabetes mellitus with other specified complication: Secondary | ICD-10-CM | POA: Diagnosis not present

## 2018-12-28 DIAGNOSIS — Z23 Encounter for immunization: Secondary | ICD-10-CM | POA: Diagnosis not present

## 2019-01-06 DIAGNOSIS — Z1159 Encounter for screening for other viral diseases: Secondary | ICD-10-CM | POA: Diagnosis not present

## 2019-01-11 DIAGNOSIS — K640 First degree hemorrhoids: Secondary | ICD-10-CM | POA: Diagnosis not present

## 2019-01-11 DIAGNOSIS — Z1211 Encounter for screening for malignant neoplasm of colon: Secondary | ICD-10-CM | POA: Diagnosis not present

## 2019-01-22 ENCOUNTER — Other Ambulatory Visit: Payer: Self-pay

## 2019-01-22 ENCOUNTER — Encounter (INDEPENDENT_AMBULATORY_CARE_PROVIDER_SITE_OTHER): Payer: Self-pay | Admitting: Otolaryngology

## 2019-01-22 ENCOUNTER — Ambulatory Visit (INDEPENDENT_AMBULATORY_CARE_PROVIDER_SITE_OTHER): Payer: BC Managed Care – PPO | Admitting: Otolaryngology

## 2019-01-22 VITALS — Temp 97.9°F

## 2019-01-22 DIAGNOSIS — H66012 Acute suppurative otitis media with spontaneous rupture of ear drum, left ear: Secondary | ICD-10-CM | POA: Diagnosis not present

## 2019-01-22 NOTE — Progress Notes (Signed)
HPI: Maria Berry is a 68 y.o. female who presents for evaluation of left ear drainage for the past 4 days.  She wears bilateral hearing aids.  She has minimal left ear discomfort.  However she has been having chronic drainage from the left ear for the past 3 days.  She has been unable to wear hearing aid on the left side.  She denies any sinus issues.  She has had no fever.  She does have type 2 diabetes and takes Metformin.  Past Medical History:  Diagnosis Date  . Hypertension    No past surgical history on file. Social History   Socioeconomic History  . Marital status: Divorced    Spouse name: Not on file  . Number of children: Not on file  . Years of education: Not on file  . Highest education level: Not on file  Occupational History  . Not on file  Social Needs  . Financial resource strain: Not on file  . Food insecurity    Worry: Not on file    Inability: Not on file  . Transportation needs    Medical: Not on file    Non-medical: Not on file  Tobacco Use  . Smoking status: Never Smoker  . Smokeless tobacco: Never Used  Substance and Sexual Activity  . Alcohol use: No  . Drug use: No  . Sexual activity: Not on file  Lifestyle  . Physical activity    Days per week: Not on file    Minutes per session: Not on file  . Stress: Not on file  Relationships  . Social Musician on phone: Not on file    Gets together: Not on file    Attends religious service: Not on file    Active member of club or organization: Not on file    Attends meetings of clubs or organizations: Not on file    Relationship status: Not on file  Other Topics Concern  . Not on file  Social History Narrative  . Not on file   Family History  Problem Relation Age of Onset  . Diabetes Other   . Stroke Other   . Hypertension Other    No Known Allergies Prior to Admission medications   Medication Sig Start Date End Date Taking? Authorizing Provider  aspirin 81 MG tablet Take 81 mg  by mouth daily.   Yes [provider]  losartan-hydrochlorothiazide (HYZAAR) 50-12.5 MG per tablet Take 1 tablet by mouth daily.   Yes [provider]  Multiple Vitamin (MULTIVITAMIN) tablet Take 1 tablet by mouth daily.   Yes [provider]     Positive ROS: Otherwise negative  All other systems have been reviewed and were otherwise negative with the exception of those mentioned in the HPI and as above.  Physical Exam: Constitutional: Alert, well-appearing, no acute distress Ears: Right ear canal reveals a small amount of wax that was removed with a curette.  The TM was otherwise clear.  The left ear reveals a mucopurulent discharge within the ear canal that was cleaned with suction.  The TM is bulging with some drainage consistent with acute left otitis media.  I applied Otovel drops to the left ear. Nasal: External nose without lesions. Septum relatively midline.. Clear nasal passages.  Chronic rhinitis with no signs of active infection. Oral: Lips and gums without lesions. Tongue and palate mucosa without lesions. Posterior oropharynx clear. Neck: No palpable adenopathy or masses Respiratory: Breathing comfortably  Skin:  No facial/neck lesions or rash noted.  Procedures  Assessment: Acute left otitis media  Plan: Placed on Augmentin 875 mg twice daily for 10 days Also placed on Cortisporin otic suspension drops 4 drops 3 times daily for 1 week. She will notify us if she has had any persistent drainage beyond 1 week.  Radene Journey, MD

## 2019-04-30 DIAGNOSIS — E1169 Type 2 diabetes mellitus with other specified complication: Secondary | ICD-10-CM | POA: Diagnosis not present

## 2019-04-30 DIAGNOSIS — Z7984 Long term (current) use of oral hypoglycemic drugs: Secondary | ICD-10-CM | POA: Diagnosis not present

## 2019-07-13 ENCOUNTER — Other Ambulatory Visit: Payer: Self-pay | Admitting: Family Medicine

## 2019-07-13 DIAGNOSIS — N63 Unspecified lump in unspecified breast: Secondary | ICD-10-CM

## 2019-08-09 ENCOUNTER — Ambulatory Visit
Admission: RE | Admit: 2019-08-09 | Discharge: 2019-08-09 | Disposition: A | Discharge status: Home / Self Care | Payer: BC Managed Care – PPO | Source: Ambulatory Visit | Attending: Family Medicine | Admitting: Family Medicine

## 2019-08-09 ENCOUNTER — Other Ambulatory Visit: Payer: Self-pay

## 2019-08-09 DIAGNOSIS — N63 Unspecified lump in unspecified breast: Secondary | ICD-10-CM

## 2020-08-06 ENCOUNTER — Ambulatory Visit (INDEPENDENT_AMBULATORY_CARE_PROVIDER_SITE_OTHER): Payer: BC Managed Care – PPO

## 2020-08-06 ENCOUNTER — Ambulatory Visit (HOSPITAL_COMMUNITY)
Admission: EM | Admit: 2020-08-06 | Discharge: 2020-08-06 | Disposition: A | Discharge status: Home / Self Care | Payer: BC Managed Care – PPO

## 2020-08-06 ENCOUNTER — Encounter (HOSPITAL_COMMUNITY): Payer: Self-pay | Admitting: Emergency Medicine

## 2020-08-06 ENCOUNTER — Other Ambulatory Visit: Payer: Self-pay

## 2020-08-06 DIAGNOSIS — M25532 Pain in left wrist: Secondary | ICD-10-CM

## 2020-08-06 DIAGNOSIS — M79645 Pain in left finger(s): Secondary | ICD-10-CM

## 2020-08-06 DIAGNOSIS — W19XXXA Unspecified fall, initial encounter: Secondary | ICD-10-CM

## 2020-08-06 MED ORDER — NAPROXEN 375 MG PO TABS: 375.0000 mg | ORAL_TABLET | Freq: Two times a day (BID) | ORAL | 0 refills | Status: AC

## 2020-08-06 NOTE — Discharge Instructions (Signed)
Take Naprosyn twice a day to help with pain and inflammation.  You should not take additional NSAIDs including aspirin, ibuprofen/Advil, naproxen/Aleve with this medication as it can cause stomach bleeding.  You can use Tylenol for additional pain relief.  Use brace to help manage pain.  If your symptoms do not improve please follow-up with specialist as we discussed.  If you have any worsening symptoms including numbness, weakness, tingling sensation you need to be reevaluated immediately.
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
## 2020-09-29 ENCOUNTER — Other Ambulatory Visit: Payer: Self-pay | Admitting: Family Medicine

## 2020-09-29 DIAGNOSIS — Z1231 Encounter for screening mammogram for malignant neoplasm of breast: Secondary | ICD-10-CM

## 2020-10-05 ENCOUNTER — Other Ambulatory Visit: Payer: Self-pay

## 2020-10-05 ENCOUNTER — Ambulatory Visit
Admission: RE | Admit: 2020-10-05 | Discharge: 2020-10-05 | Disposition: A | Discharge status: Home / Self Care | Payer: BC Managed Care – PPO | Source: Ambulatory Visit | Attending: Family Medicine | Admitting: Family Medicine

## 2020-10-05 DIAGNOSIS — Z1231 Encounter for screening mammogram for malignant neoplasm of breast: Secondary | ICD-10-CM

## 2021-06-29 ENCOUNTER — Other Ambulatory Visit: Payer: Self-pay | Admitting: Family Medicine

## 2021-06-29 DIAGNOSIS — E2839 Other primary ovarian failure: Secondary | ICD-10-CM

## 2021-07-26 ENCOUNTER — Ambulatory Visit
Admission: RE | Admit: 2021-07-26 | Discharge: 2021-07-26 | Disposition: A | Discharge status: Home / Self Care | Payer: BC Managed Care – PPO | Source: Ambulatory Visit | Attending: Family Medicine | Admitting: Family Medicine

## 2021-07-26 DIAGNOSIS — E2839 Other primary ovarian failure: Secondary | ICD-10-CM

## 2021-08-27 ENCOUNTER — Other Ambulatory Visit: Payer: Self-pay | Admitting: Family Medicine

## 2021-08-27 DIAGNOSIS — Z1231 Encounter for screening mammogram for malignant neoplasm of breast: Secondary | ICD-10-CM

## 2021-10-08 ENCOUNTER — Ambulatory Visit: Payer: BC Managed Care – PPO

## 2021-10-18 ENCOUNTER — Ambulatory Visit
Admission: RE | Admit: 2021-10-18 | Discharge: 2021-10-18 | Disposition: A | Discharge status: Home / Self Care | Payer: BC Managed Care – PPO | Source: Ambulatory Visit | Attending: Family Medicine | Admitting: Family Medicine

## 2021-10-18 DIAGNOSIS — Z1231 Encounter for screening mammogram for malignant neoplasm of breast: Secondary | ICD-10-CM

## 2022-04-18 ENCOUNTER — Other Ambulatory Visit: Payer: Self-pay | Admitting: Family Medicine

## 2022-04-18 DIAGNOSIS — R1011 Right upper quadrant pain: Secondary | ICD-10-CM

## 2022-05-16 ENCOUNTER — Ambulatory Visit
Admission: RE | Admit: 2022-05-16 | Discharge: 2022-05-16 | Disposition: A | Discharge status: Home / Self Care | Payer: BC Managed Care – PPO | Source: Ambulatory Visit | Attending: Family Medicine | Admitting: Family Medicine

## 2022-05-16 DIAGNOSIS — R1011 Right upper quadrant pain: Secondary | ICD-10-CM

## 2022-09-10 ENCOUNTER — Other Ambulatory Visit: Payer: Self-pay | Admitting: Family Medicine

## 2022-09-10 DIAGNOSIS — Z1231 Encounter for screening mammogram for malignant neoplasm of breast: Secondary | ICD-10-CM

## 2022-10-21 ENCOUNTER — Ambulatory Visit: Admission: RE | Admit: 2022-10-21 | Payer: BC Managed Care – PPO | Source: Ambulatory Visit

## 2022-10-21 DIAGNOSIS — Z1231 Encounter for screening mammogram for malignant neoplasm of breast: Secondary | ICD-10-CM

## 2023-01-03 ENCOUNTER — Encounter (HOSPITAL_COMMUNITY): Payer: Self-pay | Admitting: Emergency Medicine

## 2023-01-03 ENCOUNTER — Other Ambulatory Visit: Payer: Self-pay

## 2023-01-03 ENCOUNTER — Emergency Department (HOSPITAL_COMMUNITY): Payer: BC Managed Care – PPO

## 2023-01-03 ENCOUNTER — Emergency Department (HOSPITAL_COMMUNITY)
Admission: EM | Admit: 2023-01-03 | Discharge: 2023-01-03 | Disposition: A | Discharge status: Home / Self Care | Payer: BC Managed Care – PPO | Attending: Emergency Medicine | Admitting: Emergency Medicine

## 2023-01-03 DIAGNOSIS — S0990XA Unspecified injury of head, initial encounter: Secondary | ICD-10-CM | POA: Insufficient documentation

## 2023-01-03 DIAGNOSIS — Z79899 Other long term (current) drug therapy: Secondary | ICD-10-CM | POA: Insufficient documentation

## 2023-01-03 DIAGNOSIS — M25511 Pain in right shoulder: Secondary | ICD-10-CM | POA: Diagnosis not present

## 2023-01-03 DIAGNOSIS — I10 Essential (primary) hypertension: Secondary | ICD-10-CM | POA: Diagnosis not present

## 2023-01-03 DIAGNOSIS — S134XXA Sprain of ligaments of cervical spine, initial encounter: Secondary | ICD-10-CM | POA: Insufficient documentation

## 2023-01-03 DIAGNOSIS — Y92410 Unspecified street and highway as the place of occurrence of the external cause: Secondary | ICD-10-CM | POA: Diagnosis not present

## 2023-01-03 DIAGNOSIS — Z7982 Long term (current) use of aspirin: Secondary | ICD-10-CM | POA: Diagnosis not present

## 2023-01-03 DIAGNOSIS — S2231XA Fracture of one rib, right side, initial encounter for closed fracture: Secondary | ICD-10-CM | POA: Diagnosis not present

## 2023-01-03 DIAGNOSIS — S299XXA Unspecified injury of thorax, initial encounter: Secondary | ICD-10-CM | POA: Diagnosis present
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
## 2023-01-26 IMAGING — MG MM DIGITAL SCREENING BILAT W/ TOMO AND CAD
8 series · 8 of 24 positions shown · non-contrast
Comparison: Previous exam(s).

CLINICAL DATA: Screening.

EXAM:
DIGITAL SCREENING BILATERAL MAMMOGRAM WITH TOMOSYNTHESIS AND CAD
TECHNIQUE: Bilateral screening digital craniocaudal and mediolateral oblique
mammograms were obtained. Bilateral screening digital breast
tomosynthesis was performed. The images were evaluated with
computer-aided detection.

[L CC synth-2D]
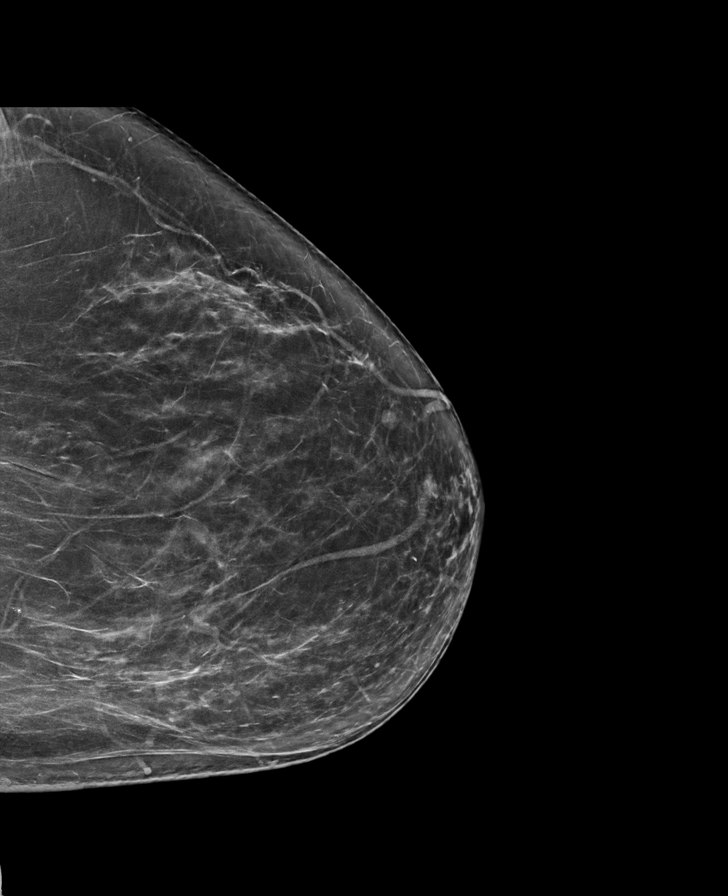

[R MLO synth-2D]
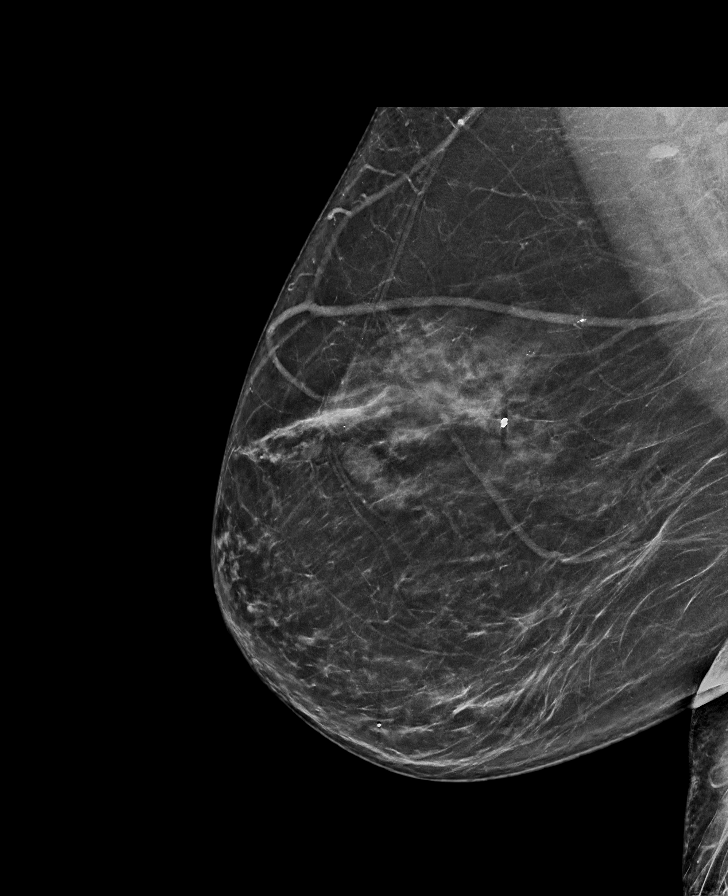

[R CC synth-2D]
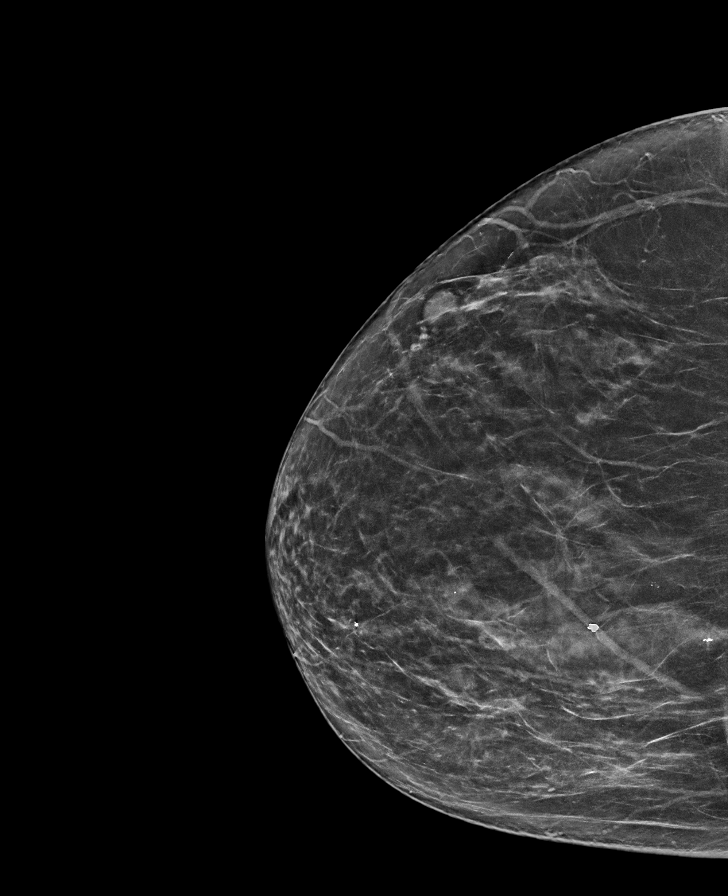

[L MLO synth-2D]
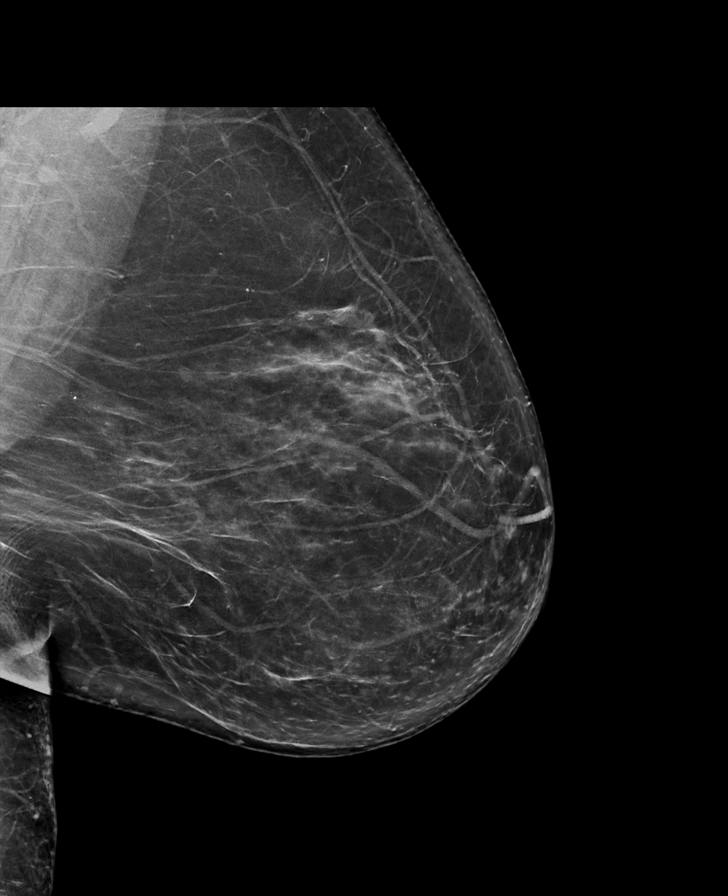

[R CC tomo · tomo slice 37/74.0]
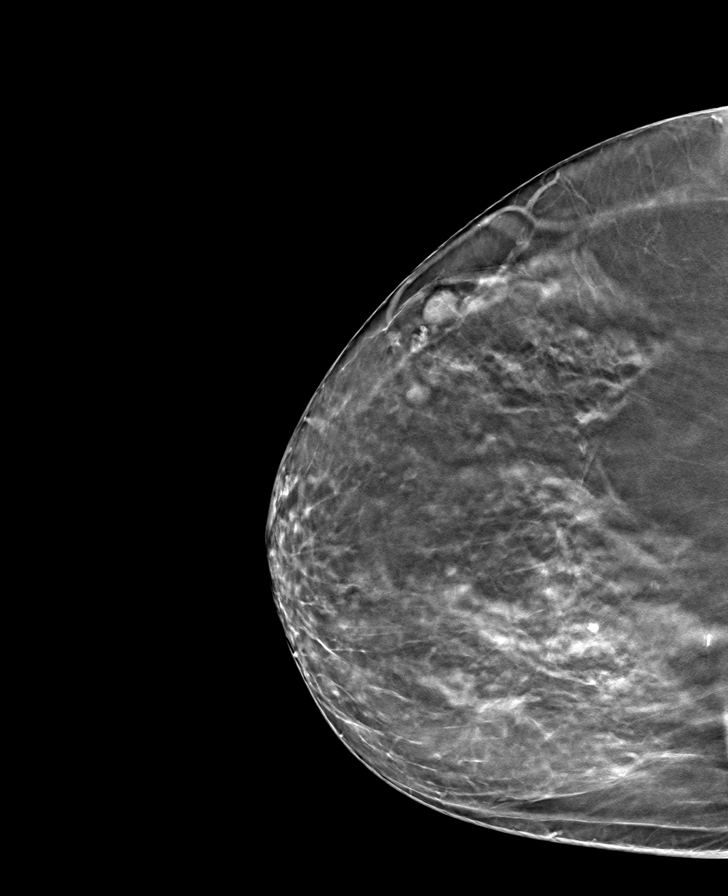

[R MLO tomo · tomo slice 42/83.0]
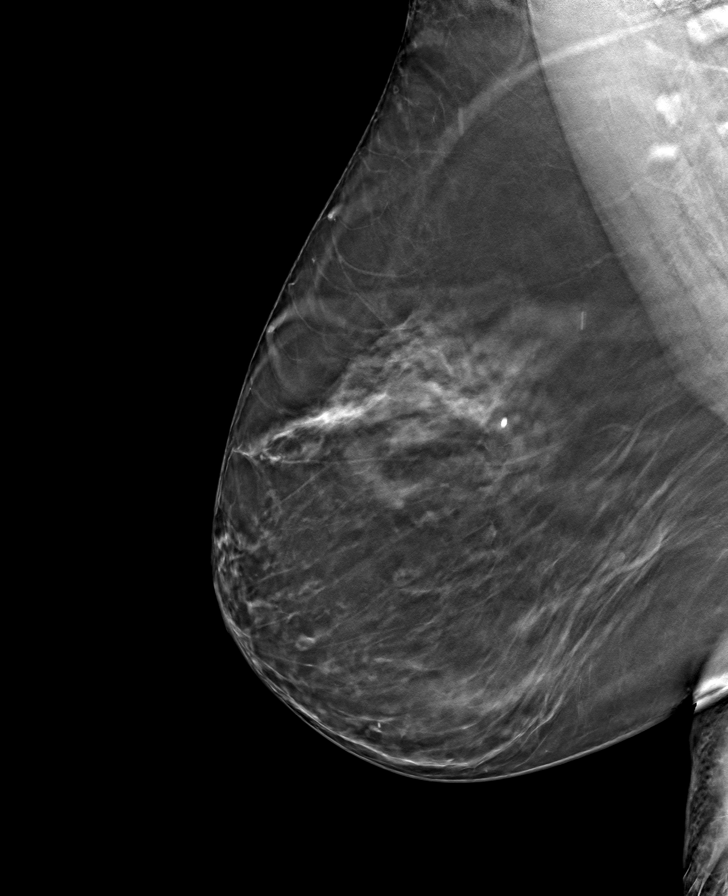

[L MLO tomo · tomo slice 44/87.0]
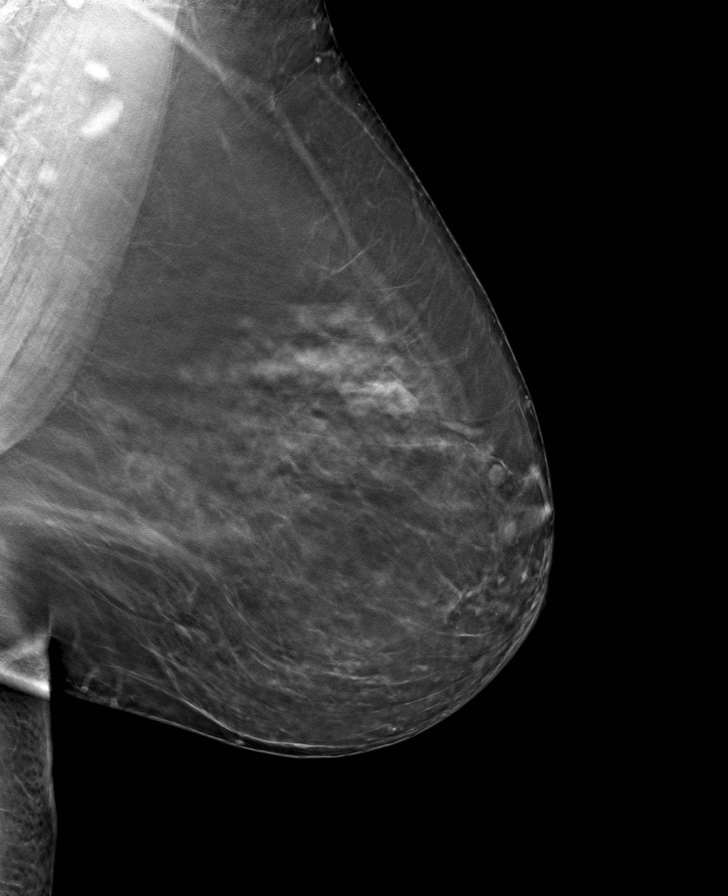

[L CC tomo · tomo slice 41/82.0]
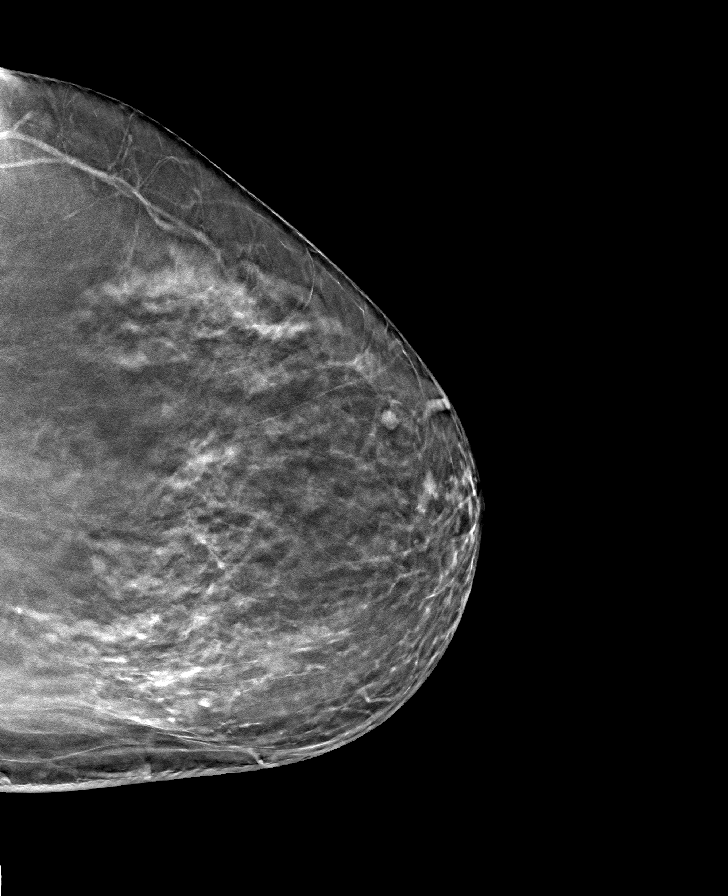

[8 of 24 positions shown; findings below may reference images not displayed]

ACR Breast Density Category c: The breast tissue is heterogeneously
dense, which may obscure small masses.
FINDINGS: There are no findings suspicious for malignancy.
IMPRESSION: No mammographic evidence of malignancy. A result letter of this
screening mammogram will be mailed directly to the patient.

RECOMMENDATION:
Screening mammogram in one year. (Code:Q3-W-BC3)

BI-RADS CATEGORY  1: Negative.

## 2023-10-21 ENCOUNTER — Other Ambulatory Visit: Payer: Self-pay | Admitting: Family Medicine

## 2023-10-21 DIAGNOSIS — Z1231 Encounter for screening mammogram for malignant neoplasm of breast: Secondary | ICD-10-CM

## 2023-11-10 ENCOUNTER — Ambulatory Visit
Admission: RE | Admit: 2023-11-10 | Discharge: 2023-11-10 | Disposition: A | Discharge status: Home / Self Care | Source: Ambulatory Visit | Attending: Family Medicine | Admitting: Family Medicine

## 2023-11-10 DIAGNOSIS — Z1231 Encounter for screening mammogram for malignant neoplasm of breast: Secondary | ICD-10-CM

## 2024-01-13 ENCOUNTER — Other Ambulatory Visit (HOSPITAL_BASED_OUTPATIENT_CLINIC_OR_DEPARTMENT_OTHER): Payer: Self-pay | Admitting: Student

## 2024-01-13 DIAGNOSIS — M810 Age-related osteoporosis without current pathological fracture: Secondary | ICD-10-CM

## 2024-07-19 ENCOUNTER — Ambulatory Visit (HOSPITAL_BASED_OUTPATIENT_CLINIC_OR_DEPARTMENT_OTHER)
